# Patient Record
Sex: Female | Born: 1965 | Race: White | Hispanic: No | Marital: Single | Smoking: Never smoker
Health system: Southern US, Community
[De-identification: ages and names within clinical notes are randomized; demographics above are authoritative.]

## PROBLEM LIST (undated history)

## (undated) DIAGNOSIS — I7300 Raynaud's syndrome without gangrene: Secondary | ICD-10-CM

## (undated) DIAGNOSIS — R42 Dizziness and giddiness: Secondary | ICD-10-CM

## (undated) DIAGNOSIS — E079 Disorder of thyroid, unspecified: Secondary | ICD-10-CM

## (undated) DIAGNOSIS — M1990 Unspecified osteoarthritis, unspecified site: Secondary | ICD-10-CM

## (undated) HISTORY — DX: Disorder of thyroid, unspecified: E07.9

## (undated) HISTORY — PX: TYMPANOSTOMY TUBE PLACEMENT: SHX32

## (undated) HISTORY — DX: Raynaud's syndrome without gangrene: I73.00

## (undated) HISTORY — PX: NASAL FRACTURE SURGERY: SHX718

## (undated) HISTORY — DX: Unspecified osteoarthritis, unspecified site: M19.90

## (undated) HISTORY — DX: Dizziness and giddiness: R42

## (undated) HISTORY — PX: OTHER SURGICAL HISTORY: SHX169

---

## 2003-11-06 HISTORY — PX: VULVECTOMY: SHX1086

## 2020-01-18 ENCOUNTER — Ambulatory Visit: Payer: Self-pay | Attending: Internal Medicine

## 2020-01-18 DIAGNOSIS — Z23 Encounter for immunization: Secondary | ICD-10-CM

## 2020-01-18 NOTE — Progress Notes (Signed)
   Covid-19 Vaccination Clinic  Name:  Beth Morse    MRN: [id number] DOB: November 12, 1965  01/18/2020  [name] was observed post Covid-19 immunization for 15 minutes without incident. She was provided with Vaccine Information Sheet and instruction to access the V-Safe system.   [name] was instructed to call 911 with any severe reactions post vaccine: Marland Kitchen Difficulty breathing  . Swelling of face and throat  . A fast heartbeat  . A bad rash all over body  . Dizziness and weakness   Immunizations Administered    Name Date Dose VIS Date Route   Pfizer COVID-19 Vaccine 01/18/2020  4:18 PM 0.3 mL 10/16/2019 Intramuscular   Manufacturer: [employer], [employer]   Lot: [id number]   NDC: [address]-8

## 2020-02-10 ENCOUNTER — Ambulatory Visit: Payer: Self-pay | Attending: Internal Medicine

## 2020-02-10 DIAGNOSIS — Z23 Encounter for immunization: Secondary | ICD-10-CM

## 2020-02-10 NOTE — Progress Notes (Signed)
   Covid-19 Vaccination Clinic  Name:  Beth Morse    MRN: [id number] DOB: 11/04/1966  02/10/2020  [name] was observed post Covid-19 immunization for 30 minutes based on pre-vaccination screening without incident. She was provided with Vaccine Information Sheet and instruction to access the V-Safe system.   [name] was instructed to call 911 with any severe reactions post vaccine: Marland Kitchen Difficulty breathing  . Swelling of face and throat  . A fast heartbeat  . A bad rash all over body  . Dizziness and weakness   Immunizations Administered    Name Date Dose VIS Date Route   Pfizer COVID-19 Vaccine 02/10/2020  1:42 PM 0.3 mL 10/16/2019 Intramuscular   Manufacturer: [employer], [employer]   Lot: [id number]   NDC: [address]-5

## 2020-07-28 ENCOUNTER — Ambulatory Visit: Payer: BC Managed Care – PPO | Admitting: Obstetrics and Gynecology

## 2020-07-28 ENCOUNTER — Encounter: Payer: Self-pay | Admitting: Obstetrics and Gynecology

## 2020-07-28 ENCOUNTER — Other Ambulatory Visit: Payer: Self-pay

## 2020-07-28 VITALS — BP 118/70 | HR 56 | Resp 16 | Ht 64.75 in | Wt 160.8 lb

## 2020-07-28 DIAGNOSIS — Z9089 Acquired absence of other organs: Secondary | ICD-10-CM | POA: Diagnosis not present

## 2020-07-28 DIAGNOSIS — Z01419 Encounter for gynecological examination (general) (routine) without abnormal findings: Secondary | ICD-10-CM | POA: Diagnosis not present

## 2020-07-28 NOTE — Progress Notes (Signed)
54 y.o. G74P0010 Single Caucasian female here for new patient annual exam.    No menses.  Minor hot flashes.   Hx vulvectomy in 2006.  Treatment in [place].  Uncertain of the level of abnormal change present.   States everything feels normal. No vulva itching.   Moved here from [place], [place].  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   Labs with PCP.  Had [employer] vaccine, completed in April, 2021.   PCP: Sigmund Hazel, MD    Patient's last menstrual period was 02/09/2019 (exact date).           Sexually active: No.  The current method of family planning is post menopausal status.    Exercising: Yes.    walks 1.5 miles qod and plays tennis Smoker:  no  Health Maintenance: Pap:  ?2019 History of abnormal Pap:  Yes, years ago, 2005. Did colposcopy.  No treatment.  MMG: 08/2019 in [place], [place]--normal Colonoscopy: 2017 normal in [place];10 years BMD:   n/a  Result  n/a TDaP: Unsure--declines Gardasil:   no HIV: no Hep C:no Screening Labs:  PCP.  Shingrex planned.  Declines flu vaccine.    reports that she has never smoked. She has never used smokeless tobacco. She reports current alcohol use of about 4.0 - 5.0 standard drinks of alcohol per week. She reports previous drug use.  Past Medical History:  Diagnosis Date   Raynaud's disease    Thyroid disease    Vertigo     Past Surgical History:  Procedure Laterality Date   adenoid removal     NASAL FRACTURE SURGERY     TYMPANOSTOMY TUBE PLACEMENT     later removed   VULVECTOMY  2005    Current Outpatient Medications  Medication Sig Dispense Refill   EPINEPHrine 0.3 mg/0.3 mL IJ SOAJ injection Inject 0.3 mg into the muscle as needed for anaphylaxis.     fluticasone (FLONASE) 50 MCG/ACT nasal spray Place into both nostrils.     Probiotic Product (ALIGN) 4 MG CAPS Take 1 capsule by mouth daily.     RESTASIS 0.05 % ophthalmic emulsion      SYNTHROID 50 MCG tablet Take  50 mcg by mouth daily.     No current facility-administered medications for this visit.    Family History  Problem Relation Age of Onset   Alzheimer's disease Father    Diabetes Father    Hypertension Father    Heart murmur Sister    Cancer Maternal Aunt    Stroke Maternal Grandfather     Review of Systems  All other systems reviewed and are negative.   Exam:   BP 118/70    Pulse (!) 56    Resp 16    Ht 5' 4.75" (1.645 m)    Wt 160 lb 12.8 oz (72.9 kg)    LMP 02/09/2019 (Exact Date)    BMI 26.97 kg/m     General appearance: alert, cooperative and appears stated age Head: normocephalic, without obvious abnormality, atraumatic Neck: no adenopathy, supple, symmetrical, trachea midline and thyroid normal to inspection and palpation Lungs: clear to auscultation bilaterally Breasts: normal appearance, no masses or tenderness, No nipple retraction or dimpling, No nipple discharge or bleeding, No axillary adenopathy Heart: regular rate and rhythm Abdomen: soft, non-tender; no masses, no organomegaly Extremities: extremities normal, atraumatic, no cyanosis or edema Skin: skin color, texture, turgor normal. No rashes or lesions Lymph nodes: cervical, supraclavicular, and axillary nodes normal. Neurologic: grossly normal  Pelvic: External genitalia:  no lesions              No abnormal inguinal nodes palpated.              Urethra:  normal appearing urethra with no masses, tenderness or lesions              Bartholins and Skenes: normal                 Vagina: normal appearing vagina with normal color and discharge, no lesions              Cervix: no lesions              Pap taken: No. Bimanual Exam:  Uterus:  normal size, contour, position, consistency, mobility, non-tender              Adnexa: no mass, fullness, tenderness              Rectal exam: Yes.  .  Confirms.              Anus:  normal sphincter tone, no lesions  Chaperone was present for exam.  Assessment:    Well woman visit with normal exam. Hx vulvectomy.  Remote hx of abnormal pap.   Plan: Mammogram screening discussed. Self breast awareness reviewed. Pap and HR HPV not collected.  New guidelines reviewed.  Guidelines for Calcium, Vitamin D, regular exercise program including cardiovascular and weight bearing exercise. I encouraged patient to get a copy of her medical records for her to have on file.  She declines to send records to the office.  She would like to contact [facility] to see when her last pap and HR HPV testing was done.  Follow up annually and prn.   After visit summary provided.

## 2020-07-28 NOTE — Patient Instructions (Signed)

## 2020-07-29 ENCOUNTER — Other Ambulatory Visit: Payer: Self-pay | Admitting: Obstetrics and Gynecology

## 2020-07-29 DIAGNOSIS — Z1231 Encounter for screening mammogram for malignant neoplasm of breast: Secondary | ICD-10-CM

## 2020-08-25 ENCOUNTER — Ambulatory Visit: Payer: Self-pay

## 2020-09-17 ENCOUNTER — Ambulatory Visit: Payer: Self-pay | Attending: Internal Medicine

## 2020-09-17 DIAGNOSIS — Z23 Encounter for immunization: Secondary | ICD-10-CM

## 2020-09-17 NOTE — Progress Notes (Signed)
   Covid-19 Vaccination Clinic  Name:  Beth Morse    MRN: [id number] DOB: Oct 22, 1966  09/17/2020  Ms. Hubner was observed post Covid-19 immunization for 15 minutes without incident. She was provided with Vaccine Information Sheet and instruction to access the V-Safe system.   Ms. Sposito was instructed to call 911 with any severe reactions post vaccine: Marland Kitchen Difficulty breathing  . Swelling of face and throat  . A fast heartbeat  . A bad rash all over body  . Dizziness and weakness   Immunizations Administered    Name Date Dose VIS Date Route   Pfizer COVID-19 Vaccine 09/17/2020 12:03 PM 0.3 mL 08/24/2020 Intramuscular   Manufacturer: [employer], [employer]   Lot: [id number]   NDC: [address]-2

## 2020-09-21 ENCOUNTER — Ambulatory Visit: Payer: Self-pay

## 2020-11-01 ENCOUNTER — Other Ambulatory Visit: Payer: Self-pay

## 2020-11-01 ENCOUNTER — Ambulatory Visit
Admission: RE | Admit: 2020-11-01 | Discharge: 2020-11-01 | Disposition: A | Discharge status: Home / Self Care | Payer: BC Managed Care – PPO | Source: Ambulatory Visit | Attending: Obstetrics and Gynecology | Admitting: Obstetrics and Gynecology

## 2020-11-01 DIAGNOSIS — Z1231 Encounter for screening mammogram for malignant neoplasm of breast: Secondary | ICD-10-CM

## 2021-03-14 ENCOUNTER — Other Ambulatory Visit: Payer: Self-pay

## 2021-03-14 ENCOUNTER — Other Ambulatory Visit (HOSPITAL_BASED_OUTPATIENT_CLINIC_OR_DEPARTMENT_OTHER): Payer: Self-pay

## 2021-03-14 ENCOUNTER — Ambulatory Visit: Payer: Self-pay | Attending: Internal Medicine

## 2021-03-14 DIAGNOSIS — Z23 Encounter for immunization: Secondary | ICD-10-CM

## 2021-03-14 MED ORDER — PFIZER-BIONT COVID-19 VAC-TRIS 30 MCG/0.3ML IM SUSP
INTRAMUSCULAR | 0 refills | Status: DC
  Filled 2021-03-14: qty 0.3, 1d supply, fill #0

## 2021-03-14 NOTE — Progress Notes (Signed)
   Covid-19 Vaccination Clinic  Name:  Beth Morse    MRN: [id number] DOB: 1965/11/15  03/14/2021  [name] was observed post Covid-19 immunization for 15 minutes without incident. She was provided with Vaccine Information Sheet and instruction to access the V-Safe system.   [name] was instructed to call 911 with any severe reactions post vaccine: Marland Kitchen Difficulty breathing  . Swelling of face and throat  . A fast heartbeat  . A bad rash all over body  . Dizziness and weakness   Immunizations Administered    Name Date Dose VIS Date Route   PFIZER Comrnaty(Gray TOP) Covid-19 Vaccine 03/14/2021  9:34 AM 0.3 mL 10/13/2020 Intramuscular   Manufacturer: [employer], [employer]   Lot: [id number]   NDC: [phone]

## 2021-04-16 DIAGNOSIS — Z8616 Personal history of COVID-19: Secondary | ICD-10-CM

## 2021-04-16 HISTORY — DX: Personal history of COVID-19: Z86.16

## 2021-08-15 ENCOUNTER — Other Ambulatory Visit: Payer: Self-pay | Admitting: Obstetrics and Gynecology

## 2021-08-15 DIAGNOSIS — Z1231 Encounter for screening mammogram for malignant neoplasm of breast: Secondary | ICD-10-CM

## 2021-08-18 ENCOUNTER — Ambulatory Visit: Payer: BC Managed Care – PPO | Admitting: Obstetrics and Gynecology

## 2021-11-02 ENCOUNTER — Ambulatory Visit
Admission: RE | Admit: 2021-11-02 | Discharge: 2021-11-02 | Disposition: A | Discharge status: Home / Self Care | Payer: BC Managed Care – PPO | Source: Ambulatory Visit | Attending: Obstetrics and Gynecology | Admitting: Obstetrics and Gynecology

## 2021-11-02 DIAGNOSIS — Z1231 Encounter for screening mammogram for malignant neoplasm of breast: Secondary | ICD-10-CM

## 2021-11-10 ENCOUNTER — Ambulatory Visit: Payer: BC Managed Care – PPO | Attending: Internal Medicine

## 2021-11-10 DIAGNOSIS — Z23 Encounter for immunization: Secondary | ICD-10-CM

## 2021-11-13 ENCOUNTER — Other Ambulatory Visit (HOSPITAL_BASED_OUTPATIENT_CLINIC_OR_DEPARTMENT_OTHER): Payer: Self-pay

## 2021-11-13 MED ORDER — PFIZER COVID-19 VAC BIVALENT 30 MCG/0.3ML IM SUSP
INTRAMUSCULAR | 0 refills | Status: DC
  Filled 2021-11-13: qty 0.3, 1d supply, fill #0

## 2021-11-13 NOTE — Progress Notes (Signed)
° °  Covid-19 Vaccination Clinic  Name:  Beth Morse    MRN: [id number] DOB: Mar 16, 1966  11/13/2021  [name] was observed post Covid-19 immunization for 15 minutes without incident. She was provided with Vaccine Information Sheet and instruction to access the V-Safe system.   [name] was instructed to call 911 with any severe reactions post vaccine: Difficulty breathing  Swelling of face and throat  A fast heartbeat  A bad rash all over body  Dizziness and weakness   Immunizations Administered     Name Date Dose VIS Date Route   Pfizer Covid-19 Vaccine Bivalent Booster 11/10/2021  2:23 PM 0.3 mL 07/05/2021 Intramuscular   Manufacturer: [employer], [employer]   Lot: [id number]   NDC: [id number]

## 2021-12-20 NOTE — Progress Notes (Signed)
56 y.o. G22P0010 Single Caucasian female here for annual exam.    No gynecologic concerns today.   Had elevated cholesterol which she reduced through dietary change.   PCP:  Sigmund Hazel, MD   Patient's last menstrual period was 02/09/2019 (exact date).           Sexually active: No.  The current method of family planning is post menopausal status.    Exercising: Yes.     Tennis, walking Smoker:  no  Health Maintenance: Pap: 08-12-17 normal per patient in [place] History of abnormal Pap:  yes, years ago, 2005. Did colposcopy.  No treatment.  MMG:  11-02-21 Neg/BiRads1 Colonoscopy:  2017 normal in [place];10 years BMD:   n/a  Result  n/a TDaP:Unsure--declines Gardasil:   no HIV: no Hep C: no Screening Labs:  PCP.  Flu vaccine:  declined Covid:  bivalent booster done.    reports that she has never smoked. She has never used smokeless tobacco. She reports current alcohol use of about 4.0 - 5.0 standard drinks per week. She reports that she does not currently use drugs.  Past Medical History:  Diagnosis Date   History of COVID-19 04/16/2021   Raynaud's disease    Thyroid disease    Vertigo     Past Surgical History:  Procedure Laterality Date   adenoid removal     NASAL FRACTURE SURGERY     TYMPANOSTOMY TUBE PLACEMENT     later removed   VULVECTOMY  2005    Current Outpatient Medications  Medication Sig Dispense Refill   Azelastine HCl 137 MCG/SPRAY SOLN Place into both nostrils.     cycloSPORINE (RESTASIS) 0.05 % ophthalmic emulsion 1 drop into affected eye     EPINEPHrine 0.3 mg/0.3 mL IJ SOAJ injection Inject 0.3 mg into the muscle as needed for anaphylaxis.     fluticasone (FLONASE) 50 MCG/ACT nasal spray Place into both nostrils.     SYNTHROID 50 MCG tablet Take 50 mcg by mouth daily.     No current facility-administered medications for this visit.    Family History  Problem Relation Age of Onset   Alzheimer's disease Father    Diabetes Father     Hypertension Father    Heart murmur Sister    Breast cancer Maternal Aunt        31s   Cancer Maternal Aunt    Stroke Maternal Grandfather     Review of Systems  All other systems reviewed and are negative.  Exam:   BP 118/80    Pulse 61    Ht 5' 4.5" (1.638 m)    Wt 156 lb (70.8 kg)    LMP 02/09/2019 (Exact Date)    SpO2 98%    BMI 26.36 kg/m     General appearance: alert, cooperative and appears stated age Head: normocephalic, without obvious abnormality, atraumatic Neck: no adenopathy, supple, symmetrical, trachea midline and thyroid normal to inspection and palpation Lungs: clear to auscultation bilaterally Breasts: normal appearance, no masses or tenderness, No nipple retraction or dimpling, No nipple discharge or bleeding, No axillary adenopathy Heart: regular rate and rhythm Abdomen: soft, non-tender; no masses, no organomegaly Extremities: extremities normal, atraumatic, no cyanosis or edema Skin: skin color, texture, turgor normal. No rashes or lesions Lymph nodes: cervical, supraclavicular, and axillary nodes normal. Neurologic: grossly normal  Pelvic: External genitalia:  no lesions              No abnormal inguinal nodes palpated.  Urethra:  normal appearing urethra with no masses, tenderness or lesions              Bartholins and Skenes: normal                 Vagina: normal appearing vagina with normal color and discharge, no lesions              Cervix: no lesions              Pap taken: yes Bimanual Exam:  Uterus:  normal size, contour, position, consistency, mobility, non-tender              Adnexa: no mass, fullness, tenderness              Rectal exam: yes.  Confirms.              Anus:  normal sphincter tone, no lesions  Chaperone was present for exam:  Marchelle Folks, CMA  Assessment:   Well woman visit with gynecologic exam. Hx vulvectomy.   No vulvar lesions noted.  Remote hx of abnormal pap.   Plan: Mammogram screening discussed. Self breast  awareness reviewed. Pap and HR HPV collected.  Guidelines for Calcium, Vitamin D, regular exercise program including cardiovascular and weight bearing exercise. Labs with PCP.  Follow up annually and prn.   After visit summary provided.

## 2021-12-21 ENCOUNTER — Encounter: Payer: Self-pay | Admitting: Obstetrics and Gynecology

## 2021-12-21 ENCOUNTER — Ambulatory Visit (INDEPENDENT_AMBULATORY_CARE_PROVIDER_SITE_OTHER): Payer: BC Managed Care – PPO | Admitting: Obstetrics and Gynecology

## 2021-12-21 ENCOUNTER — Other Ambulatory Visit (HOSPITAL_COMMUNITY)
Admission: RE | Admit: 2021-12-21 | Discharge: 2021-12-21 | Disposition: A | Discharge status: Home / Self Care | Payer: BC Managed Care – PPO | Source: Ambulatory Visit | Attending: Obstetrics and Gynecology | Admitting: Obstetrics and Gynecology

## 2021-12-21 ENCOUNTER — Other Ambulatory Visit: Payer: Self-pay

## 2021-12-21 VITALS — BP 118/80 | HR 61 | Ht 64.5 in | Wt 156.0 lb

## 2021-12-21 DIAGNOSIS — Z01419 Encounter for gynecological examination (general) (routine) without abnormal findings: Secondary | ICD-10-CM | POA: Diagnosis not present

## 2021-12-21 DIAGNOSIS — Z124 Encounter for screening for malignant neoplasm of cervix: Secondary | ICD-10-CM

## 2021-12-21 NOTE — Patient Instructions (Signed)

## 2021-12-22 LAB — CYTOLOGY - PAP
Comment: NEGATIVE
Diagnosis: NEGATIVE
High risk HPV: NEGATIVE

## 2022-10-31 ENCOUNTER — Other Ambulatory Visit: Payer: Self-pay | Admitting: Obstetrics and Gynecology

## 2022-10-31 DIAGNOSIS — Z1231 Encounter for screening mammogram for malignant neoplasm of breast: Secondary | ICD-10-CM

## 2022-11-06 ENCOUNTER — Ambulatory Visit
Admission: RE | Admit: 2022-11-06 | Discharge: 2022-11-06 | Disposition: A | Discharge status: Home / Self Care | Payer: BC Managed Care – PPO | Source: Ambulatory Visit | Attending: Obstetrics and Gynecology | Admitting: Obstetrics and Gynecology

## 2022-11-06 DIAGNOSIS — Z1231 Encounter for screening mammogram for malignant neoplasm of breast: Secondary | ICD-10-CM

## 2022-12-27 ENCOUNTER — Ambulatory Visit: Payer: BC Managed Care – PPO | Admitting: Obstetrics and Gynecology

## 2023-01-01 NOTE — Progress Notes (Signed)
57 y.o. G55P0010 Single Caucasian female here for annual exam.    Has seen ENT for dizziness.  Not getting resolution of her symptoms.   Denies vaginal bleeding.   No vulvar concerns.   PCP:   Kathyrn Lass, MD   Patient's last menstrual period was 02/09/2019 (exact date).           Sexually active: No.    The current method of family planning is post menopausal status.    Exercising: No.  The patient does not participate in regular exercise at present. Smoker:  no  Health Maintenance: Pap:  12/21/21 neg: HR HPV neg History of abnormal Pap:  yes, years ago, 2005. Did colposcopy.  No treatment.   MMG:  11/06/22 Breast Density Category B, BI-RADS CATEGORY 1 Neg Colonoscopy:  2017 normal in [place];10 years  BMD:   n/a  Result  n/a TDaP:  unsure- declined Gardasil:   no HIV: no Hep C: no Screening Labs:  Hb today: PCP, Urine today: not collected   reports that she has never smoked. She has never used smokeless tobacco. She reports current alcohol use of about 4.0 - 5.0 standard drinks of alcohol per week. She reports that she does not currently use drugs.  Past Medical History:  Diagnosis Date   History of COVID-19 04/16/2021   Raynaud's disease    Thyroid disease    Vertigo     Past Surgical History:  Procedure Laterality Date   adenoid removal     NASAL FRACTURE SURGERY     TYMPANOSTOMY TUBE PLACEMENT     later removed   VULVECTOMY  2005    Current Outpatient Medications  Medication Sig Dispense Refill   cycloSPORINE (RESTASIS) 0.05 % ophthalmic emulsion 1 drop into affected eye     EPINEPHrine 0.3 mg/0.3 mL IJ SOAJ injection Inject 0.3 mg into the muscle as needed for anaphylaxis.     fluticasone (FLONASE) 50 MCG/ACT nasal spray Place into both nostrils.     Multiple Vitamins-Minerals (CENTRUM SILVER 50+WOMEN) TABS 1 tablet Orally once a day     SYNTHROID 50 MCG tablet Take 50 mcg by mouth daily.     No current facility-administered medications for this visit.     Family History  Problem Relation Age of Onset   Alzheimer's disease Father    Diabetes Father    Hypertension Father    Heart murmur Sister    Breast cancer Maternal Aunt        91s   Cancer Maternal Aunt    Stroke Maternal Grandfather     Review of Systems  All other systems reviewed and are negative.   Exam:   BP 110/78   Pulse 70   Resp 12   Ht 5' 4.75" (1.645 m)   Wt 160 lb (72.6 kg)   LMP 02/09/2019 (Exact Date)   BMI 26.83 kg/m     General appearance: alert, cooperative and appears stated age Head: normocephalic, without obvious abnormality, atraumatic Neck: no adenopathy, supple, symmetrical, trachea midline and thyroid normal to inspection and palpation Lungs: clear to auscultation bilaterally Breasts: normal appearance, no masses or tenderness, No nipple retraction or dimpling, No nipple discharge or bleeding, No axillary adenopathy Heart: regular rate and rhythm Abdomen: soft, non-tender; no masses, no organomegaly Extremities: extremities normal, atraumatic, no cyanosis or edema Skin: skin color, texture, turgor normal. No rashes or lesions Lymph nodes: cervical, supraclavicular, and axillary nodes normal. Neurologic: grossly normal  Pelvic: External genitalia:  no lesions  No abnormal inguinal nodes palpated.              Urethra:  normal appearing urethra with no masses, tenderness or lesions              Bartholins and Skenes: normal                 Vagina: normal appearing vagina with normal color and discharge, no lesions.  Minor atrophy changes.               Cervix: no lesions              Pap taken: no Bimanual Exam:  Uterus:  normal size, contour, position, consistency, mobility, non-tender              Adnexa: no mass, fullness, tenderness              Rectal exam: declined.   Chaperone was present for exam:  Emily C.  Assessment:   Well woman visit with gynecologic exam. Hx vulvectomy.   No vulvar lesions noted.  Remote hx  of abnormal pap.  Dizziness.   Plan: Mammogram screening discussed. Self breast awareness reviewed. Pap and HR HPV 2028 Guidelines for Calcium, Vitamin D, regular exercise program including cardiovascular and weight bearing exercise. I recommended return to her PCP for further direction in evaluation of her dizziness and mentioned cardiology or neurology consultation as possible options.  Follow up annually and prn.   After visit summary provided.

## 2023-01-15 ENCOUNTER — Ambulatory Visit (INDEPENDENT_AMBULATORY_CARE_PROVIDER_SITE_OTHER): Payer: BC Managed Care – PPO | Admitting: Obstetrics and Gynecology

## 2023-01-15 ENCOUNTER — Encounter: Payer: Self-pay | Admitting: Obstetrics and Gynecology

## 2023-01-15 VITALS — BP 110/78 | HR 70 | Resp 12 | Ht 64.75 in | Wt 160.0 lb

## 2023-01-15 DIAGNOSIS — Z01419 Encounter for gynecological examination (general) (routine) without abnormal findings: Secondary | ICD-10-CM

## 2023-01-15 NOTE — Patient Instructions (Signed)

## 2023-01-24 IMAGING — MG MM DIGITAL SCREENING BILAT W/ TOMO AND CAD
6 of 10 series · 6 of 30 positions shown · non-contrast
Comparison: Previous exam(s).

CLINICAL DATA: Screening.

EXAM:
DIGITAL SCREENING BILATERAL MAMMOGRAM WITH TOMOSYNTHESIS AND CAD
TECHNIQUE: Bilateral screening digital craniocaudal and mediolateral oblique
mammograms were obtained. Bilateral screening digital breast
tomosynthesis was performed. The images were evaluated with
computer-aided detection.

[L MLO synth-2D (1 of 2)]
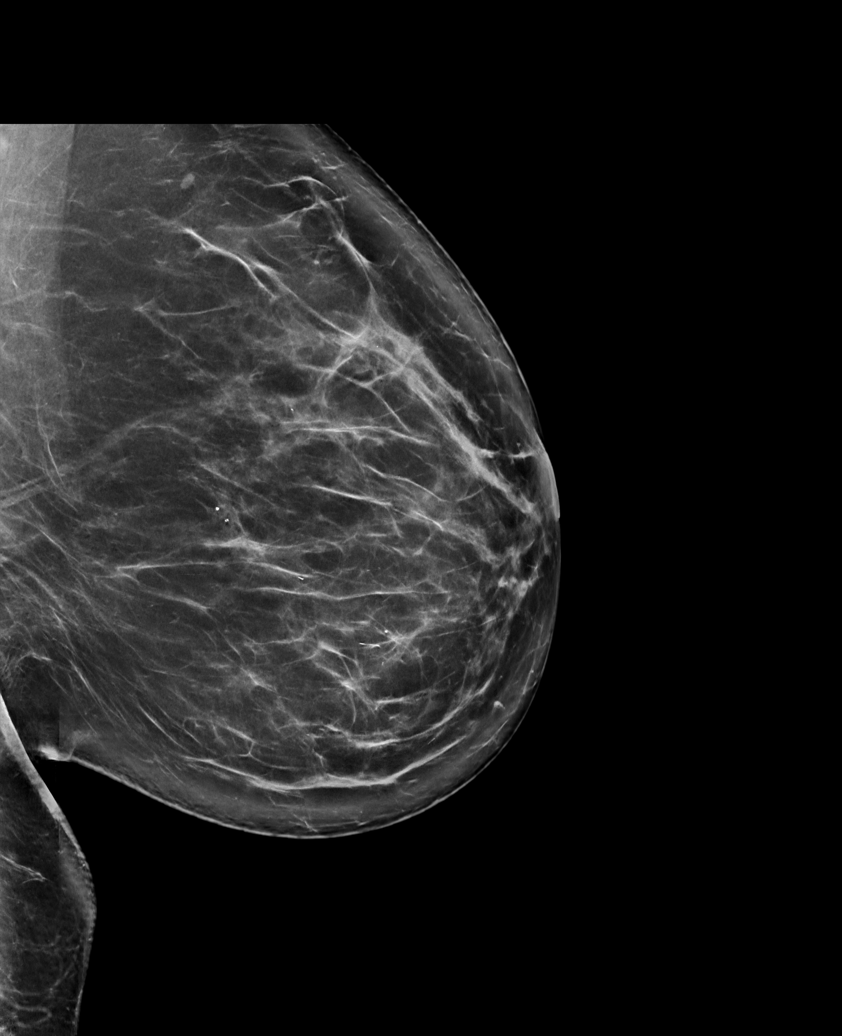

[L CC synth-2D]
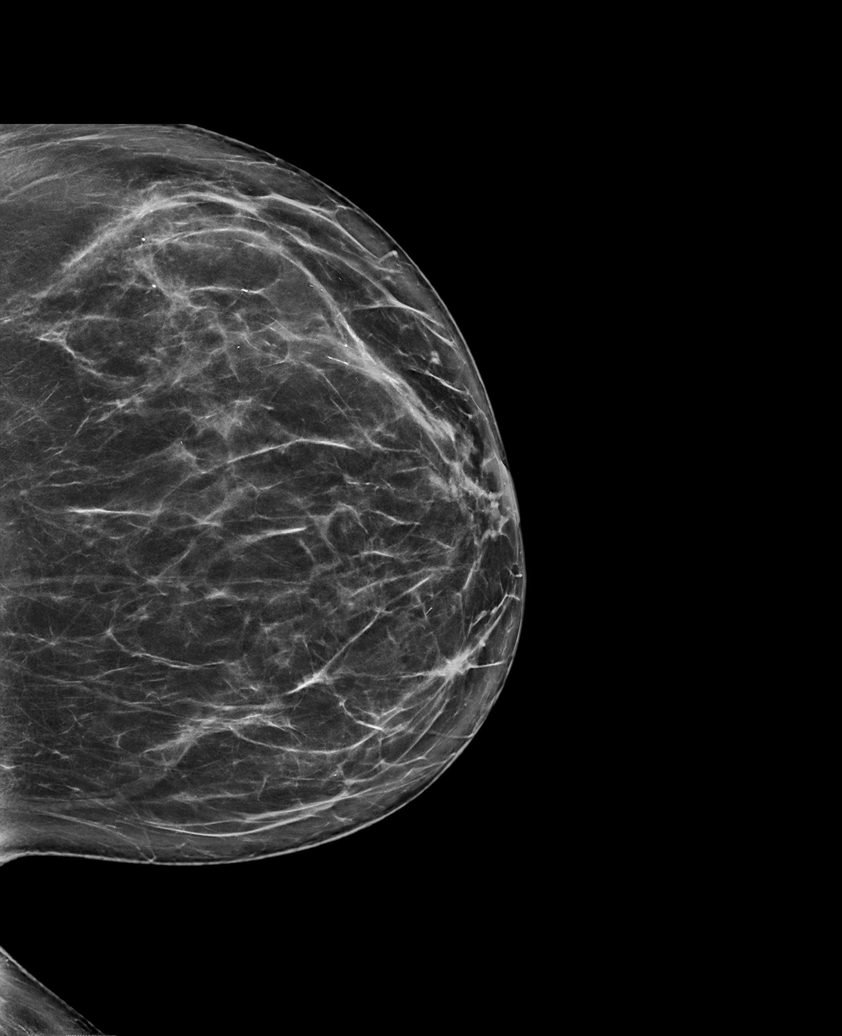

[R CC synth-2D]
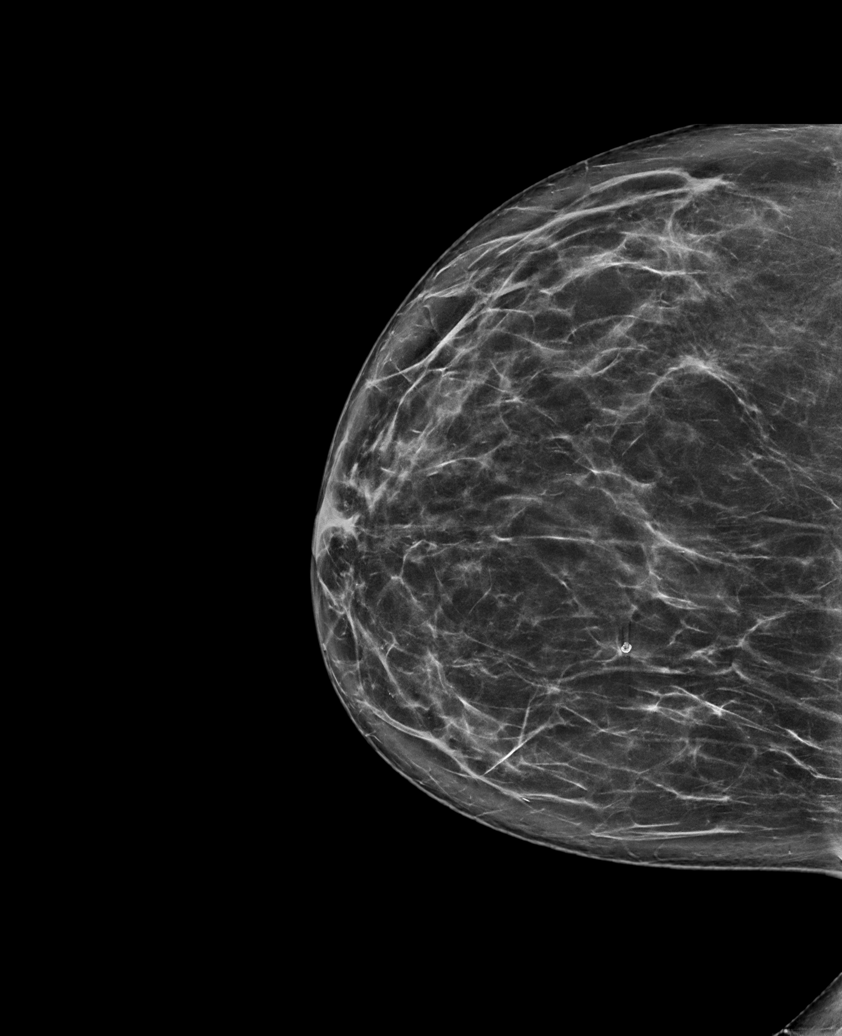

[R MLO synth-2D]
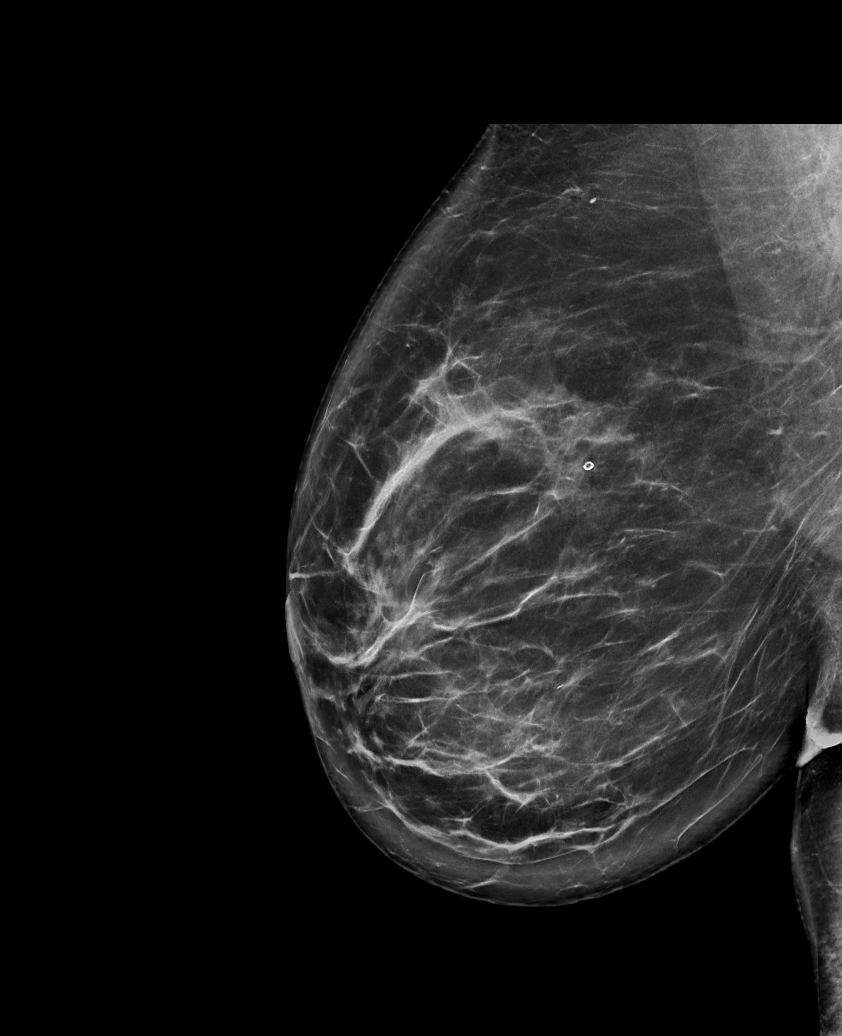

[L MLO synth-2D (2 of 2)]
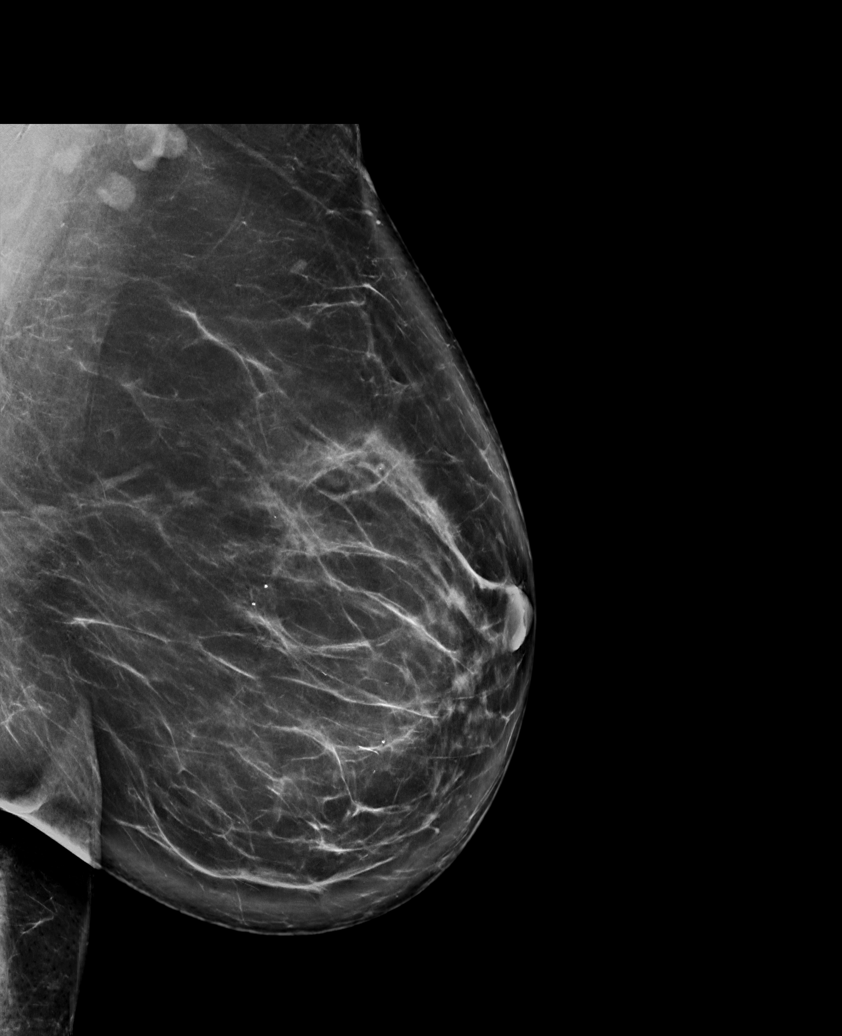

[L MLO tomo · tomo slice 51/102.0]
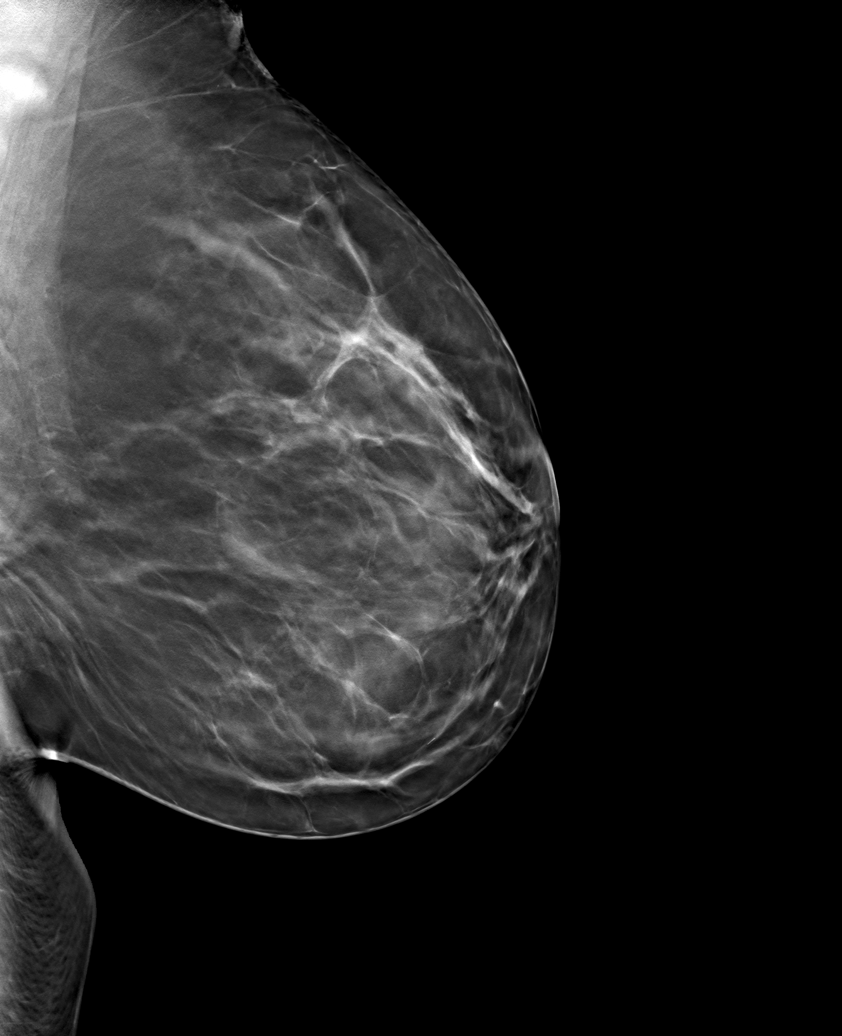

[6 of 30 positions shown; findings below may reference images not displayed]

ACR Breast Density Category b: There are scattered areas of
fibroglandular density.
FINDINGS: There are no findings suspicious for malignancy.
IMPRESSION: No mammographic evidence of malignancy. A result letter of this
screening mammogram will be mailed directly to the patient.

RECOMMENDATION:
Screening mammogram in one year. (Code:51-O-LD2)

BI-RADS CATEGORY  1: Negative.

## 2023-02-11 ENCOUNTER — Other Ambulatory Visit: Payer: Self-pay

## 2023-04-09 ENCOUNTER — Other Ambulatory Visit (HOSPITAL_COMMUNITY): Payer: Self-pay | Admitting: Family Medicine

## 2023-04-09 DIAGNOSIS — E785 Hyperlipidemia, unspecified: Secondary | ICD-10-CM

## 2023-04-10 ENCOUNTER — Other Ambulatory Visit: Payer: Self-pay

## 2023-05-08 ENCOUNTER — Ambulatory Visit (HOSPITAL_BASED_OUTPATIENT_CLINIC_OR_DEPARTMENT_OTHER)
Admission: RE | Admit: 2023-05-08 | Discharge: 2023-05-08 | Disposition: A | Discharge status: Home / Self Care | Payer: BC Managed Care – PPO | Source: Ambulatory Visit | Attending: Family Medicine | Admitting: Family Medicine

## 2023-05-08 DIAGNOSIS — E785 Hyperlipidemia, unspecified: Secondary | ICD-10-CM | POA: Insufficient documentation

## 2023-08-05 ENCOUNTER — Other Ambulatory Visit: Payer: Self-pay

## 2023-08-05 ENCOUNTER — Other Ambulatory Visit: Payer: Self-pay | Admitting: Family Medicine

## 2023-08-05 ENCOUNTER — Ambulatory Visit
Admission: RE | Admit: 2023-08-05 | Discharge: 2023-08-05 | Disposition: A | Discharge status: Home / Self Care | Payer: BC Managed Care – PPO | Source: Ambulatory Visit | Attending: Family Medicine | Admitting: Family Medicine

## 2023-08-05 DIAGNOSIS — M79641 Pain in right hand: Secondary | ICD-10-CM

## 2023-08-14 ENCOUNTER — Other Ambulatory Visit: Payer: Self-pay

## 2023-08-14 ENCOUNTER — Encounter: Payer: Self-pay | Admitting: Neurology

## 2023-08-14 DIAGNOSIS — R202 Paresthesia of skin: Secondary | ICD-10-CM

## 2023-09-16 ENCOUNTER — Ambulatory Visit: Payer: BC Managed Care – PPO | Admitting: Neurology

## 2023-09-16 DIAGNOSIS — R202 Paresthesia of skin: Secondary | ICD-10-CM

## 2023-09-16 DIAGNOSIS — M5412 Radiculopathy, cervical region: Secondary | ICD-10-CM

## 2023-09-16 NOTE — Procedures (Signed)
  [facility]  [address] [place], [address]  [place], [place] [address] Tel: [phone] Fax: [phone] Test Date:  09/16/2023  Patient: Beth Morse DOB: 12-25-1965 Physician: Jacquelyne Balint, MD  Sex: Female Height: 5' 4.75" Ref Phys: Elbert Ewings, MD  ID#: [id number]   Technician:    History: This is a 57 year old female with numbness and tingling in her right hand.  NCV & EMG Findings: Extensive electrodiagnostic evaluation of the right upper limb shows: Right median, ulnar, and radial sensory responses are within normal limits. Right median (APB) and ulnar (ADM) motor responses are within normal limits. Right ulnar (ADM) F wave latency is within normal limits. Chronic motor axon loss changes without accompanying active denervation changes are seen in the right first dorsal interosseous, abductor digiti minimi, flexor pollicis longus, and extensor indicis proprius muscles.  Impression: This is an abnormal study. 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 No electrodiagnostic evidence of a right median mononeuropathy at or distal to the wrist (ie: carpal tunnel syndrome). No electrodiagnostic evidence of a right ulnar mononeuropathy.    ___________________________ Jacquelyne Balint, MD    Nerve Conduction Studies Motor Nerve Results    Latency Amplitude F-Lat Segment Distance CV Comment  Site (ms) Norm (mV) Norm (ms)  (cm) (m/s) Norm   Right Median (APB) Motor  Wrist 2.3  < 4.0 7.5  > 6.0        Elbow 6.3 - 6.9 -  Elbow-Wrist 25 63  > 50   Right Ulnar (ADM) Motor  Wrist 1.98  < 3.1 10.2  > 7.0 25.2       Bel elbow 5.2 - 9.3 -  Bel elbow-Wrist 19.5 61  > 50   Ab elbow 6.8 - 9.0 -  Ab elbow-Bel elbow 10 63 -    Sensory Sites    Neg Peak Lat Amplitude (O-P) Segment Distance Velocity Comment  Site (ms) Norm (V)  Norm  (cm) (ms)   Right Median Sensory  Wrist-Dig II 2.5  < 3.6 63  > 15 Wrist-Dig II 13    Right Radial Sensory  Forearm-Wrist 2.0  < 2.7 39  > 14 Forearm-Wrist 10    Right Ulnar Sensory  Wrist-Dig V 2.5  < 3.1 63  > 10 Wrist-Dig V 11     Electromyography   Side Muscle Ins.Act Fibs Fasc Recrt Amp Dur Poly Activation Comment  Right FDI Nml Nml Nml *1- *1+ *1+ *1+ Nml N/A  Right ADM Nml Nml Nml *1- *1+ *1+ *1+ Nml N/A  Right EIP Nml Nml Nml *1- *1+ *1+ *1+ Nml N/A  Right FPL Nml Nml Nml *2- *1+ *1+ *1+ Nml N/A  Right Pronator teres Nml Nml Nml Nml Nml Nml Nml Nml N/A  Right Biceps Nml Nml Nml Nml Nml Nml Nml Nml N/A  Right Triceps Nml Nml Nml Nml Nml Nml Nml Nml N/A  Right Deltoid Nml Nml Nml Nml Nml Nml Nml Nml N/A  Right C7 PSP Nml Nml Nml Nml Nml Nml Nml Nml N/A      Waveforms:  Motor      Sensory        F-Wave

## 2023-10-22 ENCOUNTER — Other Ambulatory Visit: Payer: Self-pay | Admitting: Family Medicine

## 2023-10-22 DIAGNOSIS — Z1231 Encounter for screening mammogram for malignant neoplasm of breast: Secondary | ICD-10-CM

## 2023-11-14 ENCOUNTER — Ambulatory Visit: Payer: BC Managed Care – PPO

## 2023-11-29 ENCOUNTER — Ambulatory Visit
Admission: RE | Admit: 2023-11-29 | Discharge: 2023-11-29 | Disposition: A | Discharge status: Home / Self Care | Payer: 59 | Source: Ambulatory Visit | Attending: Family Medicine | Admitting: Family Medicine

## 2023-11-29 DIAGNOSIS — Z1231 Encounter for screening mammogram for malignant neoplasm of breast: Secondary | ICD-10-CM

## 2024-02-28 ENCOUNTER — Encounter: Payer: Self-pay | Admitting: Obstetrics and Gynecology

## 2024-02-28 NOTE — Progress Notes (Signed)
 58 y.o. G1P0010 Single Caucasian non-Hispanic female here for annual exam.    Having neck arthritis.   Works from home.   Has a dog, Brisco.  PCP: Perley Bradley, MD   Patient's last menstrual period was 02/09/2019 (exact date).           Sexually active: No.  No current partner. The current method of family planning is post menopausal status.    Menopausal hormone therapy: none Exercising: Yes.     5-6 days a week, walking and tennis Smoker: no  OB History  Gravida Para Term Preterm AB Living  1    1   SAB IAB Ectopic Multiple Live Births          # Outcome Date GA Lbr Len/2nd Weight Sex Type Anes PTL Lv  1 AB              HEALTH MAINTENANCE: Last 2 paps: 2013- WNL, HPV- neg, 12/21/2021-WNL, HPV- neg History of abnormal Pap or positive HPV: yes, years ago, 2005. Did colposcopy.  No treatment.  Mammogram: 11/30/2023-WNL, Cat B Colonoscopy: 2017, Due 2027  Bone Density: n/a  Result n/a   Immunization History  Administered Date(s) Administered   DT (Pediatric) 11/06/1991   PFIZER Comirnaty(Gray Top)Covid-19 Tri-Sucrose Vaccine 03/14/2021   PFIZER(Purple Top)SARS-COV-2 Vaccination 01/18/2020, 02/10/2020, 09/17/2020   Pfizer Covid-19 Vaccine Bivalent Booster 71yrs & up 11/10/2021      reports that she has never smoked. She has never used smokeless tobacco. She reports current alcohol use of about 4.0 - 5.0 standard drinks of alcohol per week. She reports that she does not currently use drugs.  Past Medical History:  Diagnosis Date   Arthritis    History of COVID-19 04/16/2021   Raynaud's disease    Thyroid disease    Vertigo     Past Surgical History:  Procedure Laterality Date   adenoid removal     NASAL FRACTURE SURGERY     TYMPANOSTOMY TUBE PLACEMENT     later removed   VULVECTOMY  2005    Current Outpatient Medications  Medication Sig Dispense Refill   cycloSPORINE (RESTASIS) 0.05 % ophthalmic emulsion 1 drop into affected eye     EPINEPHrine 0.3  mg/0.3 mL IJ SOAJ injection Inject 0.3 mg into the muscle as needed for anaphylaxis.     Multiple Vitamins-Minerals (CENTRUM SILVER 50+WOMEN) TABS 1 tablet Orally once a day     Probiotic Product (ALIGN) 10 MG CAPS 1 capsule.     SYNTHROID 50 MCG tablet Take 50 mcg by mouth daily.     No current facility-administered medications for this visit.    ALLERGIES: Lactose, Bergera koenigii (curry tree) [bergera koenigii], and Levaquin [levofloxacin]  Family History  Problem Relation Age of Onset   Alzheimer's disease Father    Diabetes Father    Hypertension Father    Heart murmur Sister    Breast cancer Maternal Aunt        67s   Cancer Maternal Aunt    Stroke Maternal Grandfather     Review of Systems  All other systems reviewed and are negative.   PHYSICAL EXAM:  BP 122/80   Pulse (!) 50   Ht 5' 4.75" (1.645 m)   Wt 164 lb (74.4 kg)   LMP 02/09/2019 (Exact Date)   SpO2 99%   BMI 27.50 kg/m     General appearance: alert, cooperative and appears stated age Head: normocephalic, without obvious abnormality, atraumatic Neck: no adenopathy, supple, symmetrical, trachea midline  and thyroid normal to inspection and palpation Lungs: clear to auscultation bilaterally Breasts: normal appearance, no masses or tenderness, No nipple retraction or dimpling, No nipple discharge or bleeding, No axillary adenopathy Heart: regular rate and rhythm Abdomen: soft, non-tender; no masses, no organomegaly Extremities: extremities normal, atraumatic, no cyanosis or edema Skin: skin color, texture, turgor normal. No rashes or lesions Lymph nodes: cervical, supraclavicular, and axillary nodes normal. Neurologic: grossly normal  Pelvic: External genitalia:  no lesions              No abnormal inguinal nodes palpated.              Urethra:  normal appearing urethra with no masses, tenderness or lesions              Bartholins and Skenes: normal                 Vagina: normal appearing vagina with  normal color and discharge, no lesions.  Atrophy noted.               Cervix: no lesions              Pap taken: no Bimanual Exam:  Uterus:  normal size, contour, position, consistency, mobility, non-tender              Adnexa: no mass, fullness, tenderness              Rectal exam: declined.   Chaperone was present for exam:  Alesia Husky, CMA  ASSESSMENT: Well woman visit with gynecologic exam. Hx vulvectomy.   No vulvar lesions noted.  Remote hx of abnormal pap.  PHQ-9: 0.  PLAN: Mammogram screening discussed. Self breast awareness reviewed. Pap and HRV collected:  no.  Due in 2028. Guidelines for Calcium, Vitamin D, regular exercise program including cardiovascular and weight bearing exercise. Medication refills:  NA Declines vaginal estrogen tx.  Labs with PCP.  Follow up: yearly and prn.

## 2024-03-02 ENCOUNTER — Encounter: Payer: Self-pay | Admitting: Obstetrics and Gynecology

## 2024-03-02 ENCOUNTER — Ambulatory Visit (INDEPENDENT_AMBULATORY_CARE_PROVIDER_SITE_OTHER): Payer: Self-pay | Admitting: Obstetrics and Gynecology

## 2024-03-02 VITALS — BP 122/80 | HR 50 | Ht 64.75 in | Wt 164.0 lb

## 2024-03-02 DIAGNOSIS — Z1331 Encounter for screening for depression: Secondary | ICD-10-CM

## 2024-03-02 DIAGNOSIS — Z01419 Encounter for gynecological examination (general) (routine) without abnormal findings: Secondary | ICD-10-CM | POA: Diagnosis not present

## 2024-03-02 NOTE — Patient Instructions (Signed)

## 2024-10-26 ENCOUNTER — Other Ambulatory Visit: Payer: Self-pay | Admitting: Family Medicine

## 2024-10-26 DIAGNOSIS — Z1231 Encounter for screening mammogram for malignant neoplasm of breast: Secondary | ICD-10-CM

## 2024-11-30 ENCOUNTER — Ambulatory Visit

## 2024-12-03 ENCOUNTER — Ambulatory Visit

## 2024-12-11 ENCOUNTER — Ambulatory Visit: Admission: RE | Admit: 2024-12-11 | Source: Ambulatory Visit

## 2024-12-11 DIAGNOSIS — Z1231 Encounter for screening mammogram for malignant neoplasm of breast: Secondary | ICD-10-CM

## 2025-03-08 ENCOUNTER — Ambulatory Visit: Admitting: Obstetrics and Gynecology
